# Patient Record
Sex: Male | Born: 1985 | Race: Asian | Hispanic: No | Marital: Married | Smoking: Never smoker
Health system: Southern US, Community
[De-identification: ages and names within clinical notes are randomized; demographics above are authoritative.]

## PROBLEM LIST (undated history)

## (undated) DIAGNOSIS — K219 Gastro-esophageal reflux disease without esophagitis: Secondary | ICD-10-CM

## (undated) DIAGNOSIS — I10 Essential (primary) hypertension: Secondary | ICD-10-CM

## (undated) HISTORY — DX: Essential (primary) hypertension: I10

## (undated) HISTORY — PX: OTHER SURGICAL HISTORY: SHX169

---

## 2008-03-06 ENCOUNTER — Emergency Department (HOSPITAL_COMMUNITY): Admission: EM | Admit: 2008-03-06 | Discharge: 2008-03-06 | Payer: Self-pay | Admitting: Emergency Medicine
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
## 2019-04-25 ENCOUNTER — Ambulatory Visit: Payer: Self-pay | Attending: Internal Medicine

## 2019-04-25 DIAGNOSIS — Z23 Encounter for immunization: Secondary | ICD-10-CM

## 2019-04-25 NOTE — Progress Notes (Signed)
   Covid-19 Vaccination Clinic  Name:  Bruce Sims    MRN: [id number] DOB: Jun 01, 1985  04/25/2019  [name] was observed post Covid-19 immunization for 15 minutes without incident. He was provided with Vaccine Information Sheet and instruction to access the V-Safe system.   [name] was instructed to call 911 with any severe reactions post vaccine: Marland Kitchen Difficulty breathing  . Swelling of face and throat  . A fast heartbeat  . A bad rash all over body  . Dizziness and weakness

## 2019-06-26 ENCOUNTER — Other Ambulatory Visit: Payer: Self-pay | Admitting: Internal Medicine

## 2019-06-26 DIAGNOSIS — Z1231 Encounter for screening mammogram for malignant neoplasm of breast: Secondary | ICD-10-CM

## 2019-07-03 ENCOUNTER — Other Ambulatory Visit: Payer: Self-pay

## 2019-07-03 ENCOUNTER — Ambulatory Visit
Admission: RE | Admit: 2019-07-03 | Discharge: 2019-07-03 | Disposition: A | Discharge status: Home / Self Care | Payer: Self-pay | Source: Ambulatory Visit | Attending: Internal Medicine | Admitting: Internal Medicine

## 2019-07-03 DIAGNOSIS — Z1231 Encounter for screening mammogram for malignant neoplasm of breast: Secondary | ICD-10-CM

## 2019-09-16 ENCOUNTER — Other Ambulatory Visit: Payer: Self-pay | Admitting: Internal Medicine

## 2019-09-16 DIAGNOSIS — R1084 Generalized abdominal pain: Secondary | ICD-10-CM

## 2019-11-27 ENCOUNTER — Other Ambulatory Visit: Payer: Self-pay

## 2019-11-27 ENCOUNTER — Ambulatory Visit (INDEPENDENT_AMBULATORY_CARE_PROVIDER_SITE_OTHER): Payer: 59 | Admitting: Internal Medicine

## 2019-11-27 VITALS — BP 170/100 | HR 87 | Temp 98.3°F | Ht 59.0 in | Wt 150.9 lb

## 2019-11-27 DIAGNOSIS — I10 Essential (primary) hypertension: Secondary | ICD-10-CM

## 2019-11-27 DIAGNOSIS — E669 Obesity, unspecified: Secondary | ICD-10-CM

## 2019-11-27 MED ORDER — HYDROCHLOROTHIAZIDE 25 MG PO TABS: 25.0000 mg | ORAL_TABLET | Freq: Every day | ORAL | 1 refills | Status: DC

## 2019-11-27 NOTE — Progress Notes (Signed)
New Patient Office Visit     This visit occurred during the SARS-CoV-2 public health emergency.  Safety protocols were in place, including screening questions prior to the visit, additional usage of staff PPE, and extensive cleaning of exam room while observing appropriate contact time as indicated for disinfecting solutions.    CC/Reason for Visit: Establish care, discuss headache Previous PCP: None Last Visit: Unknown  HPI: Bruce Sims is a 34 y.o. male who is coming in today for the above mentioned reasons.  She has no past medical history of significance but has not had routine medical care in years.  She is here today at the urging of her daughter.  She is here today with an official interpreter that is assisting with translation as she speaks very little [place].  She has been having headaches over the back of her head and neck.  No vision issues, no recent eye exam, she has had her flu vaccine but she is overdue for all other vaccinations, she had her mammogram earlier this year but has never had a colonoscopy.  She is found to have a very elevated blood pressure of 170/100 which is confirmed on repeat check.   Past Medical/Surgical History: History reviewed. No pertinent past medical history.  History reviewed. No pertinent surgical history.  Social History:  reports that she has never smoked. She has never used smokeless tobacco. She reports that she does not drink alcohol and does not use drugs.  Allergies: No Known Allergies  Family History:  History reviewed. No pertinent family history.   Current Outpatient Medications:  .  acetaminophen (TYLENOL) 325 MG tablet, Take 2 tablets (650 mg total) by mouth every 6 (six) hours as needed., Disp: 30 tablet, Rfl: 0 .  hydrochlorothiazide (HYDRODIURIL) 25 MG tablet, Take 1 tablet (25 mg total) by mouth daily., Disp: 90 tablet, Rfl: 1  Review of Systems:  Constitutional: Denies fever, chills, diaphoresis, appetite change and  fatigue.  HEENT: Denies photophobia, eye pain, redness, hearing loss, ear pain, congestion, sore throat, rhinorrhea, sneezing, mouth sores, trouble swallowing, neck pain, neck stiffness and tinnitus.   Respiratory: Denies SOB, DOE, cough, chest tightness,  and wheezing.   Cardiovascular: Denies chest pain, palpitations and leg swelling.  Gastrointestinal: Denies nausea, vomiting, abdominal pain, diarrhea, constipation, blood in stool and abdominal distention.  Genitourinary: Denies dysuria, urgency, frequency, hematuria, flank pain and difficulty urinating.  Endocrine: Denies: hot or cold intolerance, sweats, changes in hair or nails, polyuria, polydipsia. Musculoskeletal: Denies myalgias, back pain, joint swelling, arthralgias and gait problem.  Skin: Denies pallor, rash and wound.  Neurological: Denies dizziness, seizures, syncope, weakness, light-headedness, numbness. Hematological: Denies adenopathy. Easy bruising, personal or family bleeding history  Psychiatric/Behavioral: Denies suicidal ideation, mood changes, confusion, nervousness, sleep disturbance and agitation    Physical Exam: Vitals:   11/27/19 1516  BP: (!) 170/100  Pulse: 87  Temp: 98.3 F (36.8 C)  TempSrc: Oral  SpO2: 97%  Weight: 150 lb 14.4 oz (68.4 kg)  Height: 4\' 11"  (1.499 m)   Body mass index is 30.48 kg/m.  Constitutional: NAD, calm, comfortable Eyes: PERRL, lids and conjunctivae normal ENMT: Mucous membranes are moist.  Respiratory: clear to auscultation bilaterally, no wheezing, no crackles. Normal respiratory effort. No accessory muscle use.  Cardiovascular: Regular rate and rhythm, no murmurs / rubs / gallops. No extremity edema.   Neurologic: Grossly intact and nonfocal Psychiatric: Normal judgment and insight. Alert and oriented x 3. Normal mood.    Impression and Plan:  Obesity (BMI 30.0-34.9) -Discussed healthy lifestyle, including increased physical activity and better food choices to  promote weight loss.  Primary hypertension  -With very elevated blood pressures to 170/100 that is confirmed on 2 separate checks, I will go ahead and make this diagnosis. -Start hydrochlorothiazide 25 mg daily. -We have discussed low-sodium diet and she has been provided a handout. -She will return in 6 weeks for follow-up blood pressure, at that time we will also check basic metabolic profile to follow renal function and electrolytes.    Patient Instructions  -Nice seeing you today!!  -Start HCTZ 25 mg daily.  -Return in  6 weeks fasting for blood pressure check and labs.  -low sodium diet (see below).   Ch??ng trnh ?n u?ng DASH DASH Eating Plan DASH l vi?t t?t c?a "Dietary Approaches to Stop Hypertension", ngh?a l Ph??ng php ti?p c?n ch? ?? ?n u?ng ?? ng?n ch?n t?ng huy?t p. Ch??ng trnh ?n u?ng DASH l ch??ng trnh ?n u?ng lnh m?nh ? ???c ch?ng minh c tc d?ng lm gi?m huy?t p cao (t?ng huy?t p). N c?ng c th? lm gi?m nguy c? b? ti?u d??ng tup 2, b?nh tim v ??t qu?. Ch??ng trnh ?n u?ng DASH c?ng c th? gip gi?m cn. C nh?ng l?i khuyn no ?? lm theo ch??ng trnh ny?  H??ng d?n chung  Trnh ?n trn 2.300 mg (miligam) mu?i (natri) m?i ngy. N?u b? t?ng huy?t p, qu v? c th? c?n gi?m l??ng dng natri xu?ng ??n m?c 1.500 mg m?i ngy.  Gi?i h?n l??ng r??u qu v? u?ng khng qu 1 ly m?i ngy v?i ph? n? khng mang thai v 2 ly m?i ngy v?i nam gi?i. M?t ly t??ng ???ng v?i 12 ao-x? bia, 5 ao-x? r??u vang, ho?c 1 ao-x? r??u m?nh.  H?p tc v?i chuyn gia ch?m [place] s?c kh?e c?a qu v? ?? duy tr tr?ng l??ng c? th? c l?i cho s?c kh?e ho?c ?? gi?m cn. Hy h?i xem tr?ng l??ng no l l t??ng cho qu v?.  Dnh t nh?t 30 pht t?p th? d?c c th? khi?n tim qu v? ??p nhanh h?n (t?p th? d?c nh?p ?i?u) h?u h?t cc ngy trong tu?n. Cc ho?t ??ng c th? bao g?m ?i b?, b?i, ho?c ??p xe.  H?p tc v?i chuyn gia ch?m [place] s?c kh?e ho?c chuyn gia ch? ?? ?n v dinh d??ng (bc s?  chuyn khoa dinh d??ng) c?a qu v? ?? ?i?u ch?nh ch??ng trnh ?n u?ng ph h?p v?i nhu c?u calo c?a c nhn qu v?. ??c nhn th?c ph?m   Ki?m tra nhn th?c ph?m ?? xem hm l??ng natri cho m?i kh?u ph?n. Ch?n nh?ng th?c ph?m c d??i 5 ph?n tr?m L??ng natri hng ngy. Ni chung, nh?ng th?c ph?m c d??i 300 mg natri cho m?i kh?u ph?n ph h?p v?i ch??ng trnh ?n u?ng ny.  ?? tm cc lo?i ng? c?c nguyn h?t, hy ki?m t? "nguyn h?t", l t? ??u tin trong danh sch thnh ph?n. Mua s?m  Mua cc s?n ph?m dn nhn "natri th?p" ho?c "khng thm mu?i."  Mua th?c ph?m t??i. Trnh nh?ng th?c ph?m ?ng h?p v cc mn ?n ch? bi?n s?n ho?c ?ng l?nh. N?u n??ng  Trnh thm mu?i khi n?u ?n. S? d?ng gia v? khng co? mu?i ho?c th?o d??c thay v mu?i ?n ho?c mu?i bi?n. Ki?m tra v?i chuyn gia ch?m [place] s?c kh?e ho??c d???c sy? c?a quy? vi? tr??c khi s? d?ng cc s?n ph?m thay  th? mu?i.  Khng chin/rn ?? ?n. N?u ?? ?n b?ng nh?ng ph??ng php c l?i cho s?c kh?e nh? b? l, lu?c, n??ng v hun nng.  N?u ?n b?ng d?u t?t cho tim, ch?ng h?n nh? d?u  liu, c?i d?u, ??u nnh, ho?c h??ng d??ng. Ln k? ho?ch cho b?a ?n  ?n ch? ?? ?n lnh m?nh bao g?m: ? T? 5 kh?u ph?n tri cy v rau tr? ln m?i ngy. Vo m?i b?a ?n, c? g?ng dnh m?t n?a ??a cho tri cy v rau. ? T?i ?a 6-8 kh?u ph?n ng? c?c nguyn h?t m?i ngy. ? D??i 6 ao-x? th?t n?c, th?t gia c?m, ho?c c m?i ngy. M?t kh?u ph?n 3 ao-x? th?t t??ng ???ng kch th??c c?a m?t b? bi. M?t qu? tr?ng t??ng ???ng 1 ao-x?. ? 2 kh?u ph?n s?a t bo m?i ngy. ? M?t kh?u ph?n qu? h?ch, cc lo?i h?t, ho?c ??u 5 l?n m?i tu?n. ? Ch?t bo t?t cho tim. Nh?ng ch?t bo lnh m?nh c tn l axit bo Omega-3, ???c tm th?y trong cc th?c ph?m nh? h?t lanh v c n??c l?nh, nh? c mi, c h?i v c thu.  H?n ch? l??ng ?n nh?ng th?c ph?m sau ?y: ? Th?c ph?m ?ng h?p ho?c ?ng gi s?n. ? Th?c ph?m giu ch?t bo chuy?n ha , ch?ng h?n nh? th?c ph?m chin/rn. ? Th?c ph?m giu ch?t bo  bo ha, ch?ng h?n nh? th?t m?. ? ?? ng?t, cc mn trng mi?ng, ?? u?ng c ???ng v nh?ng th?c ph?m khc c b? sung ???ng. ? S?n ph?m t? s?a nguyn ch?t bo.  Khng thm mu?i vo th?c ph?m tr??c khi ?n.  C? g?ng ?n t?i thi?u 2 b?a chay m?i tu?n.  ?n nhi?u th?c ?n n?u t?i nh h?n v i?t th??c ?n ?? nh hng, th??c ?n t? ch?n v th?c ?n nhanh h?n.  Khi ?n ? nh hng, hy yu c?u th??c ?n c?a qu v? ???c n?u nha?t ho?c khng c mu?i, n?u c th?. Nh?ng lo?i th?c ?n no ???c khuy?n ngh?? Nh?ng m?c ???c li?t k c th? khng ph?i danh sch ??y ??. Hy trao ??i v?i bc s? chuyn khoa dinh d??ng v? cc l?a ch?n ch? ?? dinh d??ng no ph h?p nh?t v?i qu v?. Ng? c?c Bnh m nguyn h?t ho?c nguyn cm. M ?ng nguyn h?t ho?c nguyn cm. Ga?o l??t. B?t y?n m?ch. H?t dim m?ch (quinoa). T?m la m (bulgur). Ng? c?c nguyn h?t v ng? c?c t natri. Bnh m pita. Bnh quy gin t bo, t natri. Bnh ng nguyn cm. Derrek Gu c? Derrek Gu c? t??i ho?c ?ng l?nh (s?ng, h?p, bo? lo? ho?c n??ng). N???c p c chua v n??c rau p i?t natri ho??c gi?m natri. N??c x?t c chua va? h?n h?p c chua nho i?t natri ho??c gi?m natri. Rau ?o?ng h?p i?t natri ho??c gia?m natri. Tri cy T?t c? tri cy t??i, kh, ho?c ?ng l?nh. Tri cy ?ng h?p d??i d?ng n??c p t? nhin (khng c b? sung ???ng). Th?t v cc th?c ph?m c protein khc Ga? ho??c ga? ty bo? da. G ho?c g ty xay. Th?t heo l?c m?. C v h?i s?n. Lng tr?ng tr?ng. Cc lo?i ??u, ??u H Lan ho??c ??u l?ng kh. Qu? h?ch, b? h?t v cc lo?i h?t khng mu?i. ??u ?ng h?p khng ??p mu?i. Mi?ng th?t b n?c ? l?c m?. Th?t deli n?c, t natri. S?a S?a t bo (1%) ho?c khng bo (tch bo). Ph mt  khng bo, t bo, ho?c gi?m bo. Ph mt s?a g?n kem ho?c ph mt ricotta t natri, khng bo. S?a chua t bo ho?c khng bo. Ph mt t bo, t natri. M? v d?u B? th?c v?t m?m khng c ch?t bo chuy?n ha. D?u th?c v?t. N??c x?t mayonnaise v n??c tr?n sa lt t bo, gi?m bo,  ho?c lo?i nh? (gi?m natri). D?u h?t c?i d?u, d?u rum, d?u  liu, d?u ??u nnh v d?u h??ng d??ng. Qu? b?. Gia v? v cc th?c ph?m khc. Th?o d??c. Gia v?. H?n h?p gia v? khng c mu?i. B?ng ng va? ba?nh quy khng mu?i. ?? ng?t khng bo. Nh?ng lo?i th?c ?n no khng ???c khuy?n ngh?? Nh?ng m?c ???c li?t k c th? khng ph?i danh sch ??y ??. Hy trao ??i v?i bc s? chuyn khoa dinh d??ng v? cc l?a ch?n ch? ?? dinh d??ng no ph h?p nh?t v?i qu v?. Ng? c?c Cc lo?i bnh n??ng c ch?t bo, ch?ng h?n bnh s?ng b, bnh n??ng x?p, ho?c m?t s? lo?i bnh m. M ?ng kh ho?c cc ti b?t g?o. Derrek Gu c? Derrek Gu c? tr?n kem ho??c xa?o. Cc lo?i rau tr?n n??c x?t [place] mt. Rau ?o?ng h?p thng th??ng (khng ph?i lo?i t natri ho??c gia?m natri). N??c x?t c chua ?ng h?p va? h?n h?p c chua nho thng th??ng (khng ph?i lo?i i?t natri ho??c gi?m natri). N???c p rau v c chua thng th??ng (khng ph?i lo?i i?t natri ho??c gi?m natri). D?a chua.  liu. Tri cy Tri cy ?ng h?p ngm xi-r loa?ng ho??c ???c. Tri cy kh. Tri cy ngm trong kem ho?c n??c x?t b?. Th?t v cc th?c ph?m c protein khc Cc la?t thi?t m??. X??ng s??n. Th?t kh. Th?t l?n mu?i xng khi. Xc xch. Cc lo?i th?t hun khi v th?t h?p ch? bi?n s?n khc. Xalami. M? l?ng. Xc xch nng. Mn xc xch l?n ?? rn. Qu? h?ch v cc lo?i h?t ??p mu?i. ??u ?ng h?p c b? sung mu?i. C ?ng h?p ho?c hun khi. Nguyn qu? tr?ng ho?c lng ?? tr?ng. G ho?c g ty cn da. S?a S??a nguyn kem ho??c 2%, kem v n?a n? n?a kia. Ph mt nguyn kem ho?c ph mt kem nguyn ch?t bo. S??a chua nguyn ch?t bo ho?c s?a chua co? ????ng. [place] mt nguyn ch?t bo. B?t kem khng s?a. L??p phu? kem ?a? ?a?nh bng. [place] mt ch? bi?n s?n v [place] mt ph?t. M? v d?u B?. B? th?c v?t d?ng th?i. M? l?n. M? tr?u (shortening). B? s??a tru. M?? thi?t xng kho?i. D?u nhi?t ??i, ch?ng h?n nh? d?u d?a, d?u h?t c?, ho?c d?u c?. Gia v? v cc th?c ph?m khc. B?ng ng v bnh  quy m??n. Mu?i hnh, mu?i t?i, mu?i nm, mu?i ?n v mu?i bi?n. N???c x?t [place]. N???c x?t tartar. N??c x?t th?t quay. N???c x?t [place]. N??c t??ng, bao g?m loa?i gi?m natri. N??c x?t th?t n??ng. N???c thi?t ?ng h?p v ?ng gi. N??c m?m. D?u ho. N??c x?t cocktail. Cy c?i ng?a quy? vi? th?y trn k? ha?ng. N??c x?t c chua. M t?t. H??ng li?u th?t v ch?t la?m m?m thi?t. Tho?i b?t canh. N??t x?t nng v n??c x?t [place]. N??c x?t marinat ch? bi?n s?n ho?c ?ng gi. Gia v? taco ch? bi?n s?n ho?c ?ng gi. ?? gia v?. N???c tr?n sa la?t thng th???ng. N?i ?? tm thm thng tin:  Vi?n Tim, Ph?i v Mu Qu?c gia: [email or web address]  Hi?p H?i  Tim H[employer]. Heinz[employer]oa K?: [email or web address] Tm t?t  Ch??ng trnh ?n u?ng DASH l ch??ng trnh ?n u?ng lnh m?nh ? ???c ch?ng minh c tc d?ng lm gi?m huy?t p cao (t?ng huy?t p). N c?ng c th? lm gi?m nguy c? b? ti?u d??ng tup 2, b?nh tim v ??t qu?Marland Kitchen.  V?i ch??ng trnh ?n u?ng DASH, qu v? c?n ph?i gi?i h?n l??ng mu?i (natri) ? m?c 2.300 mg m?t ngy. N?u b? t?ng huy?t p, qu v? c th? c?n gi?m l??ng dng natri xu?ng ??n m?c 1.500 mg m?i ngy.  Khi ?ang th?c hi?n theo ch??ng trnh ?n u?ng DASH, m?c ?ch l ?n nhi?u tri cy v rau c? t??i, ng? c?c nguyn h?t, protein th?t n?c, s?a t bo v ch?t bo t?t cho tim h?n.  H?p tc v?i chuyn gia ch?m [place] s?c kh?e ho?c chuyn gia ch? ?? ?n v dinh d??ng (bc s? chuyn khoa dinh d??ng) c?a qu v? ?? ?i?u ch?nh ch??ng trnh ?n u?ng ph h?p v?i nhu c?u calo c?a c nhn qu v?. Thng tin ny khng nh?m m?c ?ch thay th? cho l?i khuyn m chuyn gia ch?m [place] s?c kh?e ni v?i qu v?. Hy b?o ??m qu v? ph?i th?o lu?n b?t k? v?n ?? g m qu v? c v?i chuyn gia ch?m [place] s?c kh?e c?a qu v?. Document Revised: 05/17/2016 Document Reviewed: 05/17/2016 Elsevier Patient Education  2020 Elsevier Inc.      Chaya JanEstela Hernandez Acosta, MD [facility]

## 2019-11-27 NOTE — Patient Instructions (Signed)
-Nice seeing you today!!  -Start HCTZ 25 mg daily.  -Return in  6 weeks fasting for blood pressure check and labs.  -low sodium diet (see below).   Ch??ng trnh ?n u?ng DASH DASH Eating Plan DASH l vi?t t?t c?a "Dietary Approaches to Stop Hypertension", ngh?a l Ph??ng php ti?p c?n ch? ?? ?n u?ng ?? ng?n ch?n t?ng huy?t p. Ch??ng trnh ?n u?ng DASH l ch??ng trnh ?n u?ng lnh m?nh ? ???c ch?ng minh c tc d?ng lm gi?m huy?t p cao (t?ng huy?t p). N c?ng c th? lm gi?m nguy c? b? ti?u d??ng tup 2, b?nh tim v ??t qu?. Ch??ng trnh ?n u?ng DASH c?ng c th? gip gi?m cn. C nh?ng l?i khuyn no ?? lm theo ch??ng trnh ny?  H??ng d?n chung  Trnh ?n trn 2.300 mg (miligam) mu?i (natri) m?i ngy. N?u b? t?ng huy?t p, qu v? c th? c?n gi?m l??ng dng natri xu?ng ??n m?c 1.500 mg m?i ngy.  Gi?i h?n l??ng r??u qu v? u?ng khng qu 1 ly m?i ngy v?i ph? n? khng mang thai v 2 ly m?i ngy v?i nam gi?i. M?t ly t??ng ???ng v?i 12 ao-x? bia, 5 ao-x? r??u vang, ho?c 1 ao-x? r??u m?nh.  H?p tc v?i chuyn gia ch?m [place] s?c kh?e c?a qu v? ?? duy tr tr?ng l??ng c? th? c l?i cho s?c kh?e ho?c ?? gi?m cn. Hy h?i xem tr?ng l??ng no l l t??ng cho qu v?.  Dnh t nh?t 30 pht t?p th? d?c c th? khi?n tim qu v? ??p nhanh h?n (t?p th? d?c nh?p ?i?u) h?u h?t cc ngy trong tu?n. Cc ho?t ??ng c th? bao g?m ?i b?, b?i, ho?c ??p xe.  H?p tc v?i chuyn gia ch?m [place] s?c kh?e ho?c chuyn gia ch? ?? ?n v dinh d??ng (bc s? chuyn khoa dinh d??ng) c?a qu v? ?? ?i?u ch?nh ch??ng trnh ?n u?ng ph h?p v?i nhu c?u calo c?a c nhn qu v?. ??c nhn th?c ph?m   Ki?m tra nhn th?c ph?m ?? xem hm l??ng natri cho m?i kh?u ph?n. Ch?n nh?ng th?c ph?m c d??i 5 ph?n tr?m L??ng natri hng ngy. Ni chung, nh?ng th?c ph?m c d??i 300 mg natri cho m?i kh?u ph?n ph h?p v?i ch??ng trnh ?n u?ng ny.  ?? tm cc lo?i ng? c?c nguyn h?t, hy ki?m t? "nguyn h?t", l t? ??u tin trong danh sch thnh  ph?n. Mua s?m  Mua cc s?n ph?m dn nhn "natri th?p" ho?c "khng thm mu?i."  Mua th?c ph?m t??i. Trnh nh?ng th?c ph?m ?ng h?p v cc mn ?n ch? bi?n s?n ho?c ?ng l?nh. N?u n??ng  Trnh thm mu?i khi n?u ?n. S? d?ng gia v? khng co? mu?i ho?c th?o d??c thay v mu?i ?n ho?c mu?i bi?n. Ki?m tra v?i chuyn gia ch?m [place] s?c kh?e ho??c d???c sy? c?a quy? vi? tr??c khi s? d?ng cc s?n ph?m thay th? mu?i.  Khng chin/rn ?? ?n. N?u ?? ?n b?ng nh?ng ph??ng php c l?i cho s?c kh?e nh? b? l, lu?c, n??ng v hun nng.  N?u ?n b?ng d?u t?t cho tim, ch?ng h?n nh? d?u  liu, c?i d?u, ??u nnh, ho?c h??ng d??ng. Ln k? ho?ch cho b?a ?n  ?n ch? ?? ?n lnh m?nh bao g?m: ? T? 5 kh?u ph?n tri cy v rau tr? ln m?i ngy. Vo m?i b?a ?n, c? g?ng dnh m?t n?a ??a cho tri cy v rau. ? T?i ?a  6-8 kh?u ph?n ng? c?c nguyn h?t m?i ngy. ? D??i 6 ao-x? th?t n?c, th?t gia c?m, ho?c c m?i ngy. M?t kh?u ph?n 3 ao-x? th?t t??ng ???ng kch th??c c?a m?t b? bi. M?t qu? tr?ng t??ng ???ng 1 ao-x?. ? 2 kh?u ph?n s?a t bo m?i ngy. ? M?t kh?u ph?n qu? h?ch, cc lo?i h?t, ho?c ??u 5 l?n m?i tu?n. ? Ch?t bo t?t cho tim. Nh?ng ch?t bo lnh m?nh c tn l axit bo Omega-3, ???c tm th?y trong cc th?c ph?m nh? h?t lanh v c n??c l?nh, nh? c mi, c h?i v c thu.  H?n ch? l??ng ?n nh?ng th?c ph?m sau ?y: ? Th?c ph?m ?ng h?p ho?c ?ng gi s?n. ? Th?c ph?m giu ch?t bo chuy?n ha , ch?ng h?n nh? th?c ph?m chin/rn. ? Th?c ph?m giu ch?t bo bo ha, ch?ng h?n nh? th?t m?. ? ?? ng?t, cc mn trng mi?ng, ?? u?ng c ???ng v nh?ng th?c ph?m khc c b? sung ???ng. ? S?n ph?m t? s?a nguyn ch?t bo.  Khng thm mu?i vo th?c ph?m tr??c khi ?n.  C? g?ng ?n t?i thi?u 2 b?a chay m?i tu?n.  ?n nhi?u th?c ?n n?u t?i nh h?n v i?t th??c ?n ?? nh hng, th??c ?n t? ch?n v th?c ?n nhanh h?n.  Khi ?n ? nh hng, hy yu c?u th??c ?n c?a qu v? ???c n?u nha?t ho?c khng c mu?i, n?u c th?. Nh?ng lo?i th?c ?n  no ???c khuy?n ngh?? Nh?ng m?c ???c li?t k c th? khng ph?i danh sch ??y ??. Hy trao ??i v?i bc s? chuyn khoa dinh d??ng v? cc l?a ch?n ch? ?? dinh d??ng no ph h?p nh?t v?i qu v?. Ng? c?c Bnh m nguyn h?t ho?c nguyn cm. M ?ng nguyn h?t ho?c nguyn cm. Ga?o l??t. B?t y?n m?ch. H?t dim m?ch (quinoa). T?m la m (bulgur). Ng? c?c nguyn h?t v ng? c?c t natri. Bnh m pita. Bnh quy gin t bo, t natri. Bnh ng nguyn cm. Derrek Gu c? Derrek Gu c? t??i ho?c ?ng l?nh (s?ng, h?p, bo? lo? ho?c n??ng). N???c p c chua v n??c rau p i?t natri ho??c gi?m natri. N??c x?t c chua va? h?n h?p c chua nho i?t natri ho??c gi?m natri. Rau ?o?ng h?p i?t natri ho??c gia?m natri. Tri cy T?t c? tri cy t??i, kh, ho?c ?ng l?nh. Tri cy ?ng h?p d??i d?ng n??c p t? nhin (khng c b? sung ???ng). Th?t v cc th?c ph?m c protein khc Ga? ho??c ga? ty bo? da. G ho?c g ty xay. Th?t heo l?c m?. C v h?i s?n. Lng tr?ng tr?ng. Cc lo?i ??u, ??u H Lan ho??c ??u l?ng kh. Qu? h?ch, b? h?t v cc lo?i h?t khng mu?i. ??u ?ng h?p khng ??p mu?i. Mi?ng th?t b n?c ? l?c m?. Th?t deli n?c, t natri. S?a S?a t bo (1%) ho?c khng bo (tch bo). Ph mt khng bo, t bo, ho?c gi?m bo. Ph mt s?a g?n kem ho?c ph mt ricotta t natri, khng bo. S?a chua t bo ho?c khng bo. Ph mt t bo, t natri. M? v d?u B? th?c v?t m?m khng c ch?t bo chuy?n ha. D?u th?c v?t. N??c x?t mayonnaise v n??c tr?n sa lt t bo, gi?m bo, ho?c lo?i nh? (gi?m natri). D?u h?t c?i d?u, d?u rum, d?u  liu, d?u ??u nnh v d?u h??ng d??ng. Qu? b?. Gia v? v cc th?c ph?m khc. Th?o d??c. Milagros Loll  v?. H?n h?p gia v? khng c mu?i. B?ng ng va? ba?nh quy khng mu?i. ?? ng?t khng bo. Nh?ng lo?i th?c ?n no khng ???c khuy?n ngh?? Nh?ng m?c ???c li?t k c th? khng ph?i danh sch ??y ??. Hy trao ??i v?i bc s? chuyn khoa dinh d??ng v? cc l?a ch?n ch? ?? dinh d??ng no ph h?p nh?t v?i qu v?. Ng? c?c Cc lo?i  bnh n??ng c ch?t bo, ch?ng h?n bnh s?ng b, bnh n??ng x?p, ho?c m?t s? lo?i bnh m. M ?ng kh ho?c cc ti b?t g?o. Derrek Gu c? Derrek Gu c? tr?n kem ho??c xa?o. Cc lo?i rau tr?n n??c x?t Matthew mt. Rau ?o?ng h?p thng th??ng (khng ph?i lo?i t natri ho??c gia?m natri). N??c x?t c chua ?ng h?p va? h?n h?p c chua nho thng th??ng (khng ph?i lo?i i?t natri ho??c gi?m natri). N???c p rau v c chua thng th??ng (khng ph?i lo?i i?t natri ho??c gi?m natri). D?a chua.  liu. Tri cy Tri cy ?ng h?p ngm xi-r loa?ng ho??c ???c. Tri cy kh. Tri cy ngm trong kem ho?c n??c x?t b?. Th?t v cc th?c ph?m c protein khc Cc la?t thi?t m??. X??ng s??n. Th?t kh. Th?t l?n mu?i xng khi. Xc xch. Cc lo?i th?t hun khi v th?t h?p ch? bi?n s?n khc. Xalami. M? l?ng. Xc xch nng. Mn xc xch l?n ?? rn. Qu? h?ch v cc lo?i h?t ??p mu?i. ??u ?ng h?p c b? sung mu?i. C ?ng h?p ho?c hun khi. Nguyn qu? tr?ng ho?c lng ?? tr?ng. G ho?c g ty cn da. S?a S??a nguyn kem ho??c 2%, kem v n?a n? n?a kia. Ph mt nguyn kem ho?c ph mt kem nguyn ch?t bo. S??a chua nguyn ch?t bo ho?c s?a chua co? ????ng. Nathaneil mt nguyn ch?t bo. B?t kem khng s?a. L??p phu? kem ?a? ?a?nh bng. Tyliek mt ch? bi?n s?n v Jahlen mt ph?t. M? v d?u B?. B? th?c v?t d?ng th?i. M? l?n. M? tr?u (shortening). B? s??a tru. M?? thi?t xng kho?i. D?u nhi?t ??i, ch?ng h?n nh? d?u d?a, d?u h?t c?, ho?c d?u c?. Gia v? v cc th?c ph?m khc. B?ng ng v bnh quy m??n. Mu?i hnh, mu?i t?i, mu?i nm, mu?i ?n v mu?i bi?n. N???c x?t [place]. N???c x?t tartar. N??c x?t th?t quay. N???c x?t [place]. N??c t??ng, bao g?m loa?i gi?m natri. N??c x?t th?t n??ng. N???c thi?t ?ng h?p v ?ng gi. N??c m?m. D?u ho. N??c x?t cocktail. Cy c?i ng?a quy? vi? th?y trn k? ha?ng. N??c x?t c chua. M t?t. H??ng li?u th?t v ch?t la?m m?m thi?t. Tho?i b?t canh. N??t x?t nng v n??c x?t [place]. N??c x?t marinat ch? bi?n s?n ho?c  ?ng gi. Gia v? taco ch? bi?n s?n ho?c ?ng gi. ?? gia v?. N???c tr?n sa la?t thng th???ng. N?i ?? tm thm thng tin:  Vi?n Tim, Ph?i v Mu Qu?c gia: [email or web address].is  Hi?p H?i [employer] K?: [email or web address] Tm t?t  Ch??ng trnh ?n u?ng DASH l ch??ng trnh ?n u?ng lnh m?nh ? ???c ch?ng minh c tc d?ng lm gi?m huy?t p cao (t?ng huy?t p). N c?ng c th? lm gi?m nguy c? b? ti?u d??ng tup 2, b?nh tim v ??t qu?Marland Kitchen  V?i ch??ng trnh ?n u?ng DASH, qu v? c?n ph?i gi?i h?n l??ng mu?i (natri) ? m?c 2.300 mg m?t ngy. N?u b? t?ng huy?t p, qu v? c th? c?n gi?m l??ng dng natri xu?ng ??n m?c 1.500 mg m?i  ngy.  Khi ?ang th?c hi?n theo ch??ng trnh ?n u?ng DASH, m?c ?ch l ?n nhi?u tri cy v rau c? t??i, ng? c?c nguyn h?t, protein th?t n?c, s?a t bo v ch?t bo t?t cho tim h?n.  H?p tc v?i chuyn gia ch?m [place] s?c kh?e ho?c chuyn gia ch? ?? ?n v dinh d??ng (bc s? chuyn khoa dinh d??ng) c?a qu v? ?? ?i?u ch?nh ch??ng trnh ?n u?ng ph h?p v?i nhu c?u calo c?a c nhn qu v?. Thng tin ny khng nh?m m?c ?ch thay th? cho l?i khuyn m chuyn gia ch?m [place] s?c kh?e ni v?i qu v?. Hy b?o ??m qu v? ph?i th?o lu?n b?t k? v?n ?? g m qu v? c v?i chuyn gia ch?m [place] s?c kh?e c?a qu v?. Document Revised: 05/17/2016 Document Reviewed: 05/17/2016 Elsevier Patient Education  2020 [employer]

## 2020-01-08 ENCOUNTER — Ambulatory Visit: Payer: Self-pay | Admitting: Internal Medicine

## 2020-03-23 ENCOUNTER — Ambulatory Visit (INDEPENDENT_AMBULATORY_CARE_PROVIDER_SITE_OTHER): Payer: 59 | Admitting: Family Medicine

## 2020-03-23 DIAGNOSIS — Z5329 Procedure and treatment not carried out because of patient's decision for other reasons: Secondary | ICD-10-CM

## 2020-03-23 NOTE — Progress Notes (Signed)
Patient was a no-show to their first patient encounter to establish care. At the [facility].   Peggyann Shoals, DO [facility], PGY-3 03/24/2020 1:30 PM

## 2021-06-05 ENCOUNTER — Ambulatory Visit (HOSPITAL_COMMUNITY)
Admission: RE | Admit: 2021-06-05 | Discharge: 2021-06-05 | Discharge status: Absent without leave | Payer: Self-pay | Source: Ambulatory Visit

## 2021-09-29 ENCOUNTER — Encounter (HOSPITAL_COMMUNITY): Payer: Self-pay | Admitting: *Deleted

## 2021-09-29 ENCOUNTER — Ambulatory Visit (HOSPITAL_COMMUNITY)
Admission: EM | Admit: 2021-09-29 | Discharge: 2021-09-29 | Disposition: A | Discharge status: Home / Self Care | Payer: No Typology Code available for payment source | Attending: Emergency Medicine | Admitting: Emergency Medicine

## 2021-09-29 DIAGNOSIS — R1032 Left lower quadrant pain: Secondary | ICD-10-CM

## 2021-09-29 HISTORY — DX: Essential (primary) hypertension: I10

## 2021-09-29 MED ORDER — ALUM & MAG HYDROXIDE-SIMETH 200-200-20 MG/5ML PO SUSP
30.0000 mL | Freq: Once | ORAL | Status: AC
  Administered 2021-09-29: 30 mL via ORAL

## 2021-09-29 MED ORDER — ALUM & MAG HYDROXIDE-SIMETH 200-200-20 MG/5ML PO SUSP
ORAL | Status: AC
  Filled 2021-09-29: qty 30

## 2021-09-29 NOTE — ED Provider Notes (Signed)
[facility]    CSN: [id number] Arrival date & time: 09/29/21  0805      History   Chief Complaint Chief Complaint  Patient presents with   Abdominal Pain    HPI Yoshio Ruby is a 36 y.o. male.   Patient presents with intermittent left lower quadrant abdominal pain for 2 days.  Symptoms are worse in the mornings, can hear a rumbling sound and pain is described as sharp and nonradiating.  Has attempted use of Tylenol which has been minimally effective.  Last bowel movement this morning, described as normal, firm and brown.  Denies nausea, vomiting, diarrhea, fever, chills, abdominal bloating, dietary changes or recent travels.   Past Medical History:  Diagnosis Date   Hypertension     There are no problems to display for this patient.   Past Surgical History:  Procedure Laterality Date   arm surgery Right        Home Medications    Prior to Admission medications   Not on File    Family History Family History  Problem Relation Age of Onset   Healthy Mother    Hypertension Father     Social History Social History   Tobacco Use   Smoking status: Never   Smokeless tobacco: Never  Vaping Use   Vaping Use: Never used  Substance Use Topics   Alcohol use: Never   Drug use: Never     Allergies   Patient has no known allergies.   Review of Systems Review of Systems  Constitutional: Negative.   Respiratory: Negative.    Cardiovascular: Negative.   Gastrointestinal:  Positive for abdominal pain. Negative for abdominal distention, anal bleeding, blood in stool, constipation, diarrhea, nausea, rectal pain and vomiting.  Skin: Negative.   Neurological: Negative.      Physical Exam Triage Vital Signs ED Triage Vitals  Enc Vitals Group     BP 09/29/21 0826 (!) 151/92     Pulse Rate 09/29/21 0826 73     Resp 09/29/21 0826 18     Temp 09/29/21 0826 98.5 F (36.9 C)     Temp Source 09/29/21 0826 Oral     SpO2 09/29/21 0826 97 %     Weight --       Height --      Head Circumference --      Peak Flow --      Pain Score 09/29/21 0822 2     Pain Loc --      Pain Edu? --      Excl. in GC? --    No data found.  Updated Vital Signs BP (!) 151/92 (BP Location: Right Arm)   Pulse 73   Temp 98.5 F (36.9 C) (Oral)   Resp 18   SpO2 97%   Visual Acuity Right Eye Distance:   Left Eye Distance:   Bilateral Distance:    Right Eye Near:   Left Eye Near:    Bilateral Near:     Physical Exam Constitutional:      Appearance: He is well-developed.  HENT:     Head: Normocephalic.  Pulmonary:     Effort: Pulmonary effort is normal.  Abdominal:     General: Abdomen is flat. Bowel sounds are increased.     Palpations: Abdomen is soft.     Tenderness: There is no abdominal tenderness.  Skin:    General: Skin is warm and dry.  Neurological:     General: No focal deficit present.  Mental Status: He is alert and oriented to person, place, and time.  Psychiatric:        Mood and Affect: Mood normal.        Behavior: Behavior normal.      UC Treatments / Results  Labs (all labs ordered are listed, but only abnormal results are displayed) Labs Reviewed - No data to display  EKG   Radiology No results found.  Procedures Procedures (including critical care time)  Medications Ordered in UC Medications - No data to display  Initial Impression / Assessment and Plan / UC Course  I have reviewed the triage vital signs and the nursing notes.  Pertinent labs & imaging results that were available during my care of the patient were reviewed by me and considered in my medical decision making (see chart for details).  Left lower quadrant abdominal pain  On exam there is minimal tenderness to the abdomen but there increased bowel sounds throughout, discussed with patient, low suspicion for an acute abdomen at this time, Maalox given in office and minimal improvement seen after evaluation on 10 to 15 minutes medicine does  not peak, discussed this with patient, if medicine deemed helpful he may purchase over-the-counter for home use, recommended continue over-the-counter analgesics and a bland diet for additional supportive care, given strict precautions that if abdominal pain worsens in severity or frequency that he is to go to the nearest emergency department for further management, verbalized understanding Final Clinical Impressions(s) / UC Diagnoses   Final diagnoses:  None   Discharge Instructions   None    ED Prescriptions   None    PDMP not reviewed this encounter.   Valinda Hoar, [place] 09/29/21 [phone]

## 2021-09-29 NOTE — Discharge Instructions (Signed)
On exam today there is no tenderness to your abdomen but I am able to hear your stomach rumbling, have a low suspicion that the cause of your symptoms is something more serious with your organs  You were given a dose of Maalox here today in the office, it will reach its maximum effect in 1 hour, if you find this medicine helpful you may purchase this medicine over-the-counter  You may continue use of Tylenol for management of your pain  While symptoms are present try to eat a blander diet while avoiding greasy and spicy foods to prevent further stomach irritation  At any point if your abdominal pain becomes severe or constant please go to the nearest emergency department for further evaluation as you may need imaging

## 2021-09-29 NOTE — ED Triage Notes (Signed)
Pt states that he has had LLQ pain x 2-3 days. No diarrhea or vomiting. He took tylenol last night.  He also would like his blood pressure and blood sugar checked just to make sure he doesn't have anything.

## 2021-11-17 ENCOUNTER — Ambulatory Visit (INDEPENDENT_AMBULATORY_CARE_PROVIDER_SITE_OTHER): Payer: No Typology Code available for payment source | Admitting: Family Medicine

## 2021-11-17 ENCOUNTER — Encounter: Payer: Self-pay | Admitting: Family Medicine

## 2021-11-17 VITALS — BP 126/64 | HR 64 | Temp 98.6°F | Ht 63.0 in | Wt 152.0 lb

## 2021-11-17 DIAGNOSIS — Z0001 Encounter for general adult medical examination with abnormal findings: Secondary | ICD-10-CM | POA: Diagnosis not present

## 2021-11-17 DIAGNOSIS — R197 Diarrhea, unspecified: Secondary | ICD-10-CM | POA: Diagnosis not present

## 2021-11-17 DIAGNOSIS — Z114 Encounter for screening for human immunodeficiency virus [HIV]: Secondary | ICD-10-CM

## 2021-11-17 DIAGNOSIS — Z136 Encounter for screening for cardiovascular disorders: Secondary | ICD-10-CM | POA: Diagnosis not present

## 2021-11-17 DIAGNOSIS — Z1159 Encounter for screening for other viral diseases: Secondary | ICD-10-CM

## 2021-11-17 DIAGNOSIS — Z23 Encounter for immunization: Secondary | ICD-10-CM | POA: Diagnosis not present

## 2021-11-17 LAB — URINALYSIS, ROUTINE W REFLEX MICROSCOPIC
Bilirubin Urine: NEGATIVE
Hgb urine dipstick: NEGATIVE
Ketones, ur: NEGATIVE
Leukocytes,Ua: NEGATIVE
Nitrite: NEGATIVE
RBC / HPF: NONE SEEN (ref 0–?)
Specific Gravity, Urine: 1.015 (ref 1.000–1.030)
Total Protein, Urine: NEGATIVE
Urine Glucose: NEGATIVE
Urobilinogen, UA: 0.2 (ref 0.0–1.0)
pH: 8.5 — AB (ref 5.0–8.0)

## 2021-11-17 LAB — CBC WITH DIFFERENTIAL/PLATELET
Basophils Absolute: 0 10*3/uL (ref 0.0–0.1)
Basophils Relative: 0.4 % (ref 0.0–3.0)
Eosinophils Absolute: 0.1 10*3/uL (ref 0.0–0.7)
Eosinophils Relative: 1.3 % (ref 0.0–5.0)
HCT: 48.5 % (ref 39.0–52.0)
Hemoglobin: 15.8 g/dL (ref 13.0–17.0)
Lymphocytes Relative: 17.1 % (ref 12.0–46.0)
Lymphs Abs: 1.7 10*3/uL (ref 0.7–4.0)
MCHC: 32.5 g/dL (ref 30.0–36.0)
MCV: 79.2 fl (ref 78.0–100.0)
Monocytes Absolute: 0.9 10*3/uL (ref 0.1–1.0)
Monocytes Relative: 9.1 % (ref 3.0–12.0)
Neutro Abs: 7.3 10*3/uL (ref 1.4–7.7)
Neutrophils Relative %: 72.1 % (ref 43.0–77.0)
Platelets: 157 10*3/uL (ref 150.0–400.0)
RBC: 6.13 Mil/uL — ABNORMAL HIGH (ref 4.22–5.81)
RDW: 14 % (ref 11.5–15.5)
WBC: 10.1 10*3/uL (ref 4.0–10.5)

## 2021-11-17 LAB — LIPID PANEL
Cholesterol: 249 mg/dL — ABNORMAL HIGH (ref 0–200)
HDL: 84.3 mg/dL (ref >39.00)
LDL Cholesterol: 128 mg/dL — ABNORMAL HIGH (ref 0–99)
NonHDL: 165.1
Total CHOL/HDL Ratio: 3
Triglycerides: 188 mg/dL — ABNORMAL HIGH (ref 0.0–149.0)
VLDL: 37.6 mg/dL (ref 0.0–40.0)

## 2021-11-17 LAB — TSH: TSH: 2.22 u[IU]/mL (ref 0.35–5.50)

## 2021-11-17 LAB — COMPREHENSIVE METABOLIC PANEL
ALT: 34 U/L (ref 0–53)
AST: 23 U/L (ref 0–37)
Albumin: 4.9 g/dL (ref 3.5–5.2)
Alkaline Phosphatase: 39 U/L (ref 39–117)
BUN: 12 mg/dL (ref 6–23)
CO2: 26 mEq/L (ref 19–32)
Calcium: 10.1 mg/dL (ref 8.4–10.5)
Chloride: 98 mEq/L (ref 96–112)
Creatinine, Ser: 0.9 mg/dL (ref 0.40–1.50)
GFR: 109.43 mL/min (ref >60.00)
Glucose, Bld: 98 mg/dL (ref 70–99)
Potassium: 4.4 mEq/L (ref 3.5–5.1)
Sodium: 138 mEq/L (ref 135–145)
Total Bilirubin: 0.8 mg/dL (ref 0.2–1.2)
Total Protein: 8.4 g/dL — ABNORMAL HIGH (ref 6.0–8.3)

## 2021-11-17 NOTE — Progress Notes (Unsigned)
Subjective:    Patient ID: Bruce Sims, male    DOB: 22-Jul-1985, 36 y.o.   MRN: [id number]  HPI Chief Complaint  Patient presents with   Annual Exam   He is new to the practice and here for a complete physical exam.  Reviewed allergies, medications, past medical, surgical, family, and social history.  Reports diarrhea without fever, chills, weight loss, abdominal pain, N/V or blood in stool.   Social history: Married. 2 children.  Denies smoking, drinking alcohol, drug use Diet: healthy  Exercise: none   Health maintenance:   Last Dental Exam: overdue  Last Eye Exam: overdue    Reviewed allergies, medications, past medical, surgical, family, and social history.     Review of Systems Review of Systems Constitutional: -fever, -chills, -sweats, -unexpected weight change,-fatigue ENT: -runny nose, -ear pain, -sore throat Cardiology:  -chest pain, -palpitations, -edema Respiratory: -cough, -shortness of breath, -wheezing Gastroenterology: -abdominal pain, -nausea, -vomiting, +diarrhea, -constipation Hematology: -bleeding or bruising problems Musculoskeletal: -arthralgias, -myalgias, -joint swelling, -back pain Ophthalmology: -vision changes Urology: -dysuria, -difficulty urinating, -hematuria, -urinary frequency, -urgency Neurology: -headache, -weakness, -tingling, -numbness       Objective:   Physical Exam BP 126/64   Pulse 64   Temp 98.6 F (37 C)   Ht 5\' 3"  (1.6 m)   Wt 152 lb (68.9 kg)   SpO2 98%   BMI 26.93 kg/m   General Appearance:    Alert, cooperative, no distress, appears stated age  Head:    Normocephalic, without obvious abnormality, atraumatic  Eyes:    PERRL, conjunctiva/corneas clear, EOM's intact  Ears:    Normal TM's and external ear canals  Nose:   Nares normal, mucosa normal, no drainage  Throat:   Lips, mucosa, and tongue normal  Neck:   Supple, no lymphadenopathy;  thyroid:  no   enlargement/tenderness/nodules  Back:    Spine nontender, no  curvature, ROM normal, no CVA     tenderness  Lungs:     Clear to auscultation bilaterally without wheezes, rales or     ronchi; respirations unlabored  Chest Wall:    No tenderness or deformity   Heart:    Regular rate and rhythm  Breast Exam:    No chest wall tenderness, masses or gynecomastia  Abdomen:     Soft, non-tender, nondistended, normoactive bowel sounds,    no masses, no hepatosplenomegaly  Genitalia:    Declines   Rectal:   Deferred due to age <40 and lack of symptoms  Extremities:   No clubbing, cyanosis or edema  Pulses:   2+ and symmetric all extremities  Skin:   Skin color, texture, turgor normal, no rashes or lesions  Lymph nodes:   Cervical, supraclavicular, and axillary nodes normal  Neurologic:   CNII-XII intact, normal strength, sensation and gait          Psych:   Normal mood, affect, hygiene and grooming.        Assessment & Plan:  Encounter for general adult medical examination with abnormal findings - Plan: CBC with Differential/Platelet, Comprehensive metabolic panel, Lipid panel, Urinalysis, Routine w reflex microscopic, Urinalysis, Routine w reflex microscopic, Lipid panel, Comprehensive metabolic panel, CBC with Differential/Platelet He is a pleasant 36 year old male who is new to the practice and here for a CPE.  Preventive health care reviewed.  Recommend scheduling dental and eye exam since he is overdue.  Counseling on healthy lifestyle including diet and exercise.  Recommend monthly self testicular exams.  Immunizations reviewed. Check  labs  Diarrhea, unspecified type - Plan: TSH, TSH -No red flag symptoms.  Check labs and follow-up if not improving.  Screening for HIV without presence of risk factors - Plan: HIV Antibody (routine testing w rflx), HIV Antibody (routine testing w rflx) -Done per screening guidelines  Need for hepatitis C screening test - Plan: Hepatitis C antibody, Hepatitis C antibody -Done per screening guidelines  Need for  immunization against influenza - Plan: Flu Vaccine QUAD 71mo+IM (Fluarix, Fluzone & Alfiuria Quad PF)

## 2021-11-17 NOTE — Patient Instructions (Signed)
For your diarrhea: stay well hydrated.  You can take over the counter Imodium for diarrhea. If it does not stop in the next few days, come back to see [place].    Preventive Care 9-37 Years Old, Male Preventive care refers to lifestyle choices and visits with your health care provider that can promote health and wellness. Preventive care visits are also called wellness exams. What can I expect for my preventive care visit? Counseling During your preventive care visit, your health care provider may ask about your: Medical history, including: Past medical problems. Family medical history. Current health, including: Emotional well-being. Home life and relationship well-being. Sexual activity. Lifestyle, including: Alcohol, nicotine or tobacco, and drug use. Access to firearms. Diet, exercise, and sleep habits. Safety issues such as seatbelt and bike helmet use. Sunscreen use. Work and work Astronomer. Physical exam Your health care provider may check your: Height and weight. These may be used to calculate your BMI (body mass index). BMI is a measurement that tells if you are at a healthy weight. Waist circumference. This measures the distance around your waistline. This measurement also tells if you are at a healthy weight and may help predict your risk of certain diseases, such as type 2 diabetes and high blood pressure. Heart rate and blood pressure. Body temperature. Skin for abnormal spots. What immunizations do I need?  Vaccines are usually given at various ages, according to a schedule. Your health care provider will recommend vaccines for you based on your age, medical history, and lifestyle or other factors, such as travel or where you work. What tests do I need? Screening Your health care provider may recommend screening tests for certain conditions. This may include: Lipid and cholesterol levels. Diabetes screening. This is done by checking your blood sugar (glucose) after  you have not eaten for a while (fasting). Hepatitis B test. Hepatitis C test. HIV (human immunodeficiency virus) test. STI (sexually transmitted infection) testing, if you are at risk. Talk with your health care provider about your test results, treatment options, and if necessary, the need for more tests. Follow these instructions at home: Eating and drinking  Eat a healthy diet that includes fresh fruits and vegetables, whole grains, lean protein, and low-fat dairy products. Drink enough fluid to keep your urine pale yellow. Take vitamin and mineral supplements as recommended by your health care provider. Do not drink alcohol if your health care provider tells you not to drink. If you drink alcohol: Limit how much you have to 0-2 drinks a day. Know how much alcohol is in your drink. In the [place] one drink equals one 12 oz bottle of beer (355 mL), one 5 oz glass of wine (148 mL), or one 1 oz glass of hard liquor (44 mL). Lifestyle Brush your teeth every morning and night with fluoride toothpaste. Floss one time each day. Exercise for at least 30 minutes 5 or more days each week. Do not use any products that contain nicotine or tobacco. These products include cigarettes, chewing tobacco, and vaping devices, such as e-cigarettes. If you need help quitting, ask your health care provider. Do not use drugs. If you are sexually active, practice safe sex. Use a condom or other form of protection to prevent STIs. Find healthy ways to manage stress, such as: Meditation, yoga, or listening to music. Journaling. Talking to a trusted person. Spending time with friends and family. Minimize exposure to UV radiation to reduce your risk of skin cancer. Safety Always wear your seat  belt while driving or riding in a vehicle. Do not drive: If you have been drinking alcohol. Do not ride with someone who has been drinking. If you have been using any mind-altering substances or drugs. While  texting. When you are tired or distracted. Wear a helmet and other protective equipment during sports activities. If you have firearms in your house, make sure you follow all gun safety procedures. Seek help if you have been physically or sexually abused. What's next? Go to your health care provider once a year for an annual wellness visit. Ask your health care provider how often you should have your eyes and teeth checked. Stay up to date on all vaccines. This information is not intended to replace advice given to you by your health care provider. Make sure you discuss any questions you have with your health care provider. Document Revised: 07/13/2020 Document Reviewed: 07/13/2020 Elsevier Patient Education  [place].

## 2021-11-20 LAB — HEPATITIS C ANTIBODY: Hepatitis C Ab: NONREACTIVE

## 2021-11-20 LAB — HIV ANTIBODY (ROUTINE TESTING W REFLEX): HIV 1&2 Ab, 4th Generation: NONREACTIVE

## 2021-11-20 NOTE — Progress Notes (Signed)
His labs are fine except his bad cholesterol (the LDL) is high at 128 and the goal is less than 100. His triglycerides are also high. I recommend cutting back on fried foods, foods high in fat and cholesterol and getting at least 150 minutes of physical activity each week.

## 2021-12-15 ENCOUNTER — Other Ambulatory Visit: Payer: Self-pay

## 2021-12-15 ENCOUNTER — Encounter (HOSPITAL_COMMUNITY): Payer: Self-pay

## 2021-12-15 ENCOUNTER — Emergency Department (HOSPITAL_COMMUNITY)
Admission: EM | Admit: 2021-12-15 | Discharge: 2021-12-15 | Disposition: A | Discharge status: Home / Self Care | Payer: No Typology Code available for payment source | Attending: Emergency Medicine | Admitting: Emergency Medicine

## 2021-12-15 ENCOUNTER — Emergency Department (HOSPITAL_COMMUNITY): Payer: No Typology Code available for payment source

## 2021-12-15 DIAGNOSIS — R1013 Epigastric pain: Secondary | ICD-10-CM | POA: Insufficient documentation

## 2021-12-15 DIAGNOSIS — R1010 Upper abdominal pain, unspecified: Secondary | ICD-10-CM

## 2021-12-15 LAB — COMPREHENSIVE METABOLIC PANEL
ALT: 45 U/L — ABNORMAL HIGH (ref 0–44)
AST: 29 U/L (ref 15–41)
Albumin: 4.6 g/dL (ref 3.5–5.0)
Alkaline Phosphatase: 42 U/L (ref 38–126)
Anion gap: 8 (ref 5–15)
BUN: 14 mg/dL (ref 6–20)
CO2: 27 mmol/L (ref 22–32)
Calcium: 9.1 mg/dL (ref 8.9–10.3)
Chloride: 102 mmol/L (ref 98–111)
Creatinine, Ser: 0.73 mg/dL (ref 0.61–1.24)
GFR, Estimated: >60 mL/min (ref >60)
Glucose, Bld: 94 mg/dL (ref 70–99)
Potassium: 3.5 mmol/L (ref 3.5–5.1)
Sodium: 137 mmol/L (ref 135–145)
Total Bilirubin: 1 mg/dL (ref 0.3–1.2)
Total Protein: 8.3 g/dL — ABNORMAL HIGH (ref 6.5–8.1)

## 2021-12-15 LAB — CBC WITH DIFFERENTIAL/PLATELET
Abs Immature Granulocytes: 0.01 K/uL (ref 0.00–0.07)
Basophils Absolute: 0.1 K/uL (ref 0.0–0.1)
Basophils Relative: 1 %
Eosinophils Absolute: 0.2 K/uL (ref 0.0–0.5)
Eosinophils Relative: 3 %
HCT: 51.1 % (ref 39.0–52.0)
Hemoglobin: 16.3 g/dL (ref 13.0–17.0)
Immature Granulocytes: 0 %
Lymphocytes Relative: 36 %
Lymphs Abs: 2.3 K/uL (ref 0.7–4.0)
MCH: 25.4 pg — ABNORMAL LOW (ref 26.0–34.0)
MCHC: 31.9 g/dL (ref 30.0–36.0)
MCV: 79.7 fL — ABNORMAL LOW (ref 80.0–100.0)
Monocytes Absolute: 0.5 K/uL (ref 0.1–1.0)
Monocytes Relative: 8 %
Neutro Abs: 3.4 K/uL (ref 1.7–7.7)
Neutrophils Relative %: 52 %
Platelets: 180 K/uL (ref 150–400)
RBC: 6.41 MIL/uL — ABNORMAL HIGH (ref 4.22–5.81)
RDW: 13.9 % (ref 11.5–15.5)
WBC: 6.5 K/uL (ref 4.0–10.5)
nRBC: 0 % (ref 0.0–0.2)

## 2021-12-15 LAB — LIPASE, BLOOD: Lipase: 35 U/L (ref 11–51)

## 2021-12-15 LAB — URINALYSIS, ROUTINE W REFLEX MICROSCOPIC
Bilirubin Urine: NEGATIVE
Glucose, UA: NEGATIVE mg/dL
Hgb urine dipstick: NEGATIVE
Ketones, ur: NEGATIVE mg/dL
Leukocytes,Ua: NEGATIVE
Nitrite: NEGATIVE
Protein, ur: NEGATIVE mg/dL
Specific Gravity, Urine: 1.008 (ref 1.005–1.030)
pH: 7 (ref 5.0–8.0)

## 2021-12-15 MED ORDER — SODIUM CHLORIDE (PF) 0.9 % IJ SOLN
INTRAMUSCULAR | Status: AC
  Filled 2021-12-15: qty 50

## 2021-12-15 MED ORDER — SODIUM CHLORIDE 0.9 % IV BOLUS
1000.0000 mL | Freq: Once | INTRAVENOUS | Status: AC
  Administered 2021-12-15: 1000 mL via INTRAVENOUS

## 2021-12-15 MED ORDER — IOHEXOL 300 MG/ML  SOLN
100.0000 mL | Freq: Once | INTRAMUSCULAR | Status: AC | PRN
  Administered 2021-12-15: 100 mL via INTRAVENOUS

## 2021-12-15 MED ORDER — PANTOPRAZOLE SODIUM 20 MG PO TBEC: 20.0000 mg | DELAYED_RELEASE_TABLET | Freq: Every day | ORAL | 1 refills | Status: DC

## 2021-12-15 NOTE — ED Provider Notes (Signed)
[facility]-EMERGENCY DEPT Provider Note   CSN: [id number] Arrival date & time: 12/15/21  9323     History {Add pertinent medical, surgical, social history, OB history to HPI:1} Chief Complaint  Patient presents with   Abdominal Pain    Bruce Sims is a 36 y.o. male.  Patient complains of epigastric abdominal pain for a couple weeks.  No vomiting no diarrhea   Abdominal Pain      Home Medications Prior to Admission medications   Medication Sig Start Date End Date Taking? Authorizing Provider  pantoprazole (PROTONIX) 20 MG tablet Take 1 tablet (20 mg total) by mouth daily. 12/15/21  Yes Bethann Berkshire, MD  hydrochlorothiazide (HYDRODIURIL) 25 MG tablet Take 1 tablet (25 mg total) by mouth daily. 11/27/19 06/03/20  Henderson Cloud, MD      Allergies    Patient has no known allergies.    Review of Systems   Review of Systems  Gastrointestinal:  Positive for abdominal pain.    Physical Exam Updated Vital Signs BP (!) 125/92   Pulse 73   Temp 98.3 F (36.8 C)   Resp 18   SpO2 99%  Physical Exam  ED Results / Procedures / Treatments   Labs (all labs ordered are listed, but only abnormal results are displayed) Labs Reviewed  CBC WITH DIFFERENTIAL/PLATELET - Abnormal; Notable for the following components:      Result Value   RBC 6.41 (*)    MCV 79.7 (*)    MCH 25.4 (*)    All other components within normal limits  COMPREHENSIVE METABOLIC PANEL - Abnormal; Notable for the following components:   Total Protein 8.3 (*)    ALT 45 (*)    All other components within normal limits  URINALYSIS, ROUTINE W REFLEX MICROSCOPIC - Abnormal; Notable for the following components:   Color, Urine STRAW (*)    All other components within normal limits  LIPASE, BLOOD    EKG None  Radiology CT ABDOMEN PELVIS W CONTRAST  Result Date: 12/15/2021 CLINICAL DATA:  Abdominal pain for 2 months. EXAM: CT ABDOMEN AND PELVIS WITH CONTRAST TECHNIQUE:  Multidetector CT imaging of the abdomen and pelvis was performed using the standard protocol following bolus administration of intravenous contrast. RADIATION DOSE REDUCTION: This exam was performed according to the departmental dose-optimization program which includes automated exposure control, adjustment of the mA and/or kV according to patient size and/or use of iterative reconstruction technique. CONTRAST:  OMNIPAQUE IOHEXOL 300 MG/ML  SOLN COMPARISON:  None Available. FINDINGS: Lower chest: The lung bases are clear. The imaged heart is unremarkable. Hepatobiliary: The liver and gallbladder are unremarkable. There is no biliary ductal dilatation. Pancreas: Unremarkable. Spleen: Unremarkable. Adrenals/Urinary Tract: The adrenals are unremarkable. The kidneys are unremarkable, with no focal lesion, stone, hydronephrosis, or hydroureter. The bladder is unremarkable. Stomach/Bowel: The stomach is unremarkable. There is no evidence of bowel obstruction. There is no abnormal bowel wall thickening or inflammatory change. The appendix is normal. Vascular/Lymphatic: The abdominal aorta is normal in course and caliber. The major branch vessels are patent. The main portal and splenic veins are patent. There is no abdominal or pelvic lymphadenopathy. Reproductive: The prostate and seminal vesicles are unremarkable. Other: There is no ascites or free air. Musculoskeletal: There is no acute osseous abnormality or suspicious osseous lesion. IMPRESSION: Unremarkable CT abdomen/pelvis with no acute finding to explain the patient's pain. Electronically Signed   By: Lesia Hausen M.D.   On: 12/15/2021 11:00  Procedures Procedures  {Document cardiac monitor, telemetry assessment procedure when appropriate:1}  Medications Ordered in ED Medications  sodium chloride (PF) 0.9 % injection (has no administration in time range)  sodium chloride 0.9 % bolus 1,000 mL (0 mLs Intravenous Stopped 12/15/21 1041)  iohexol  (OMNIPAQUE) 300 MG/ML solution 100 mL (100 mLs Intravenous Contrast Given 12/15/21 1038)    ED Course/ Medical Decision Making/ A&P  Labs and CT unremarkable.  Patient placed on protonic                         Medical Decision Making Amount and/or Complexity of Data Reviewed Labs: ordered. Radiology: ordered.  Risk Prescription drug management.  Patient with chronic abdominal pain.  He will be placed on Protonix and follow-up with GI  {Document critical care time when appropriate:1} {Document review of labs and clinical decision tools ie heart score, Chads2Vasc2 etc:1}  {Document your independent review of radiology images, and any outside records:1} {Document your discussion with family members, caretakers, and with consultants:1} {Document social determinants of health affecting pt's care:1} {Document your decision making why or why not admission, treatments were needed:1} Final Clinical Impression(s) / ED Diagnoses Final diagnoses:  Pain of upper abdomen    Rx / DC Orders ED Discharge Orders          Ordered    pantoprazole (PROTONIX) 20 MG tablet  Daily        12/15/21 1414

## 2021-12-15 NOTE — Discharge Instructions (Addendum)
Follow-up with Dr. Ewing Schlein or one of his associates in the next 2 to 3 weeks if you do not improve

## 2021-12-15 NOTE — ED Triage Notes (Signed)
Patient said he has had abdominal pain for 2 months across his whole stomach. No vomiting. Said he had diarrhea 2-3 times.

## 2021-12-18 ENCOUNTER — Telehealth: Payer: Self-pay

## 2021-12-18 ENCOUNTER — Ambulatory Visit: Payer: No Typology Code available for payment source | Admitting: Family Medicine

## 2021-12-18 NOTE — Telephone Encounter (Signed)
Transition Care Management Unsuccessful Follow-up Telephone Call  Date of discharge and from where:  12/15/2021 from [facility] ED  Attempts:  1st Attempt  Reason for unsuccessful TCM follow-up call:  No answer/busy

## 2022-03-16 ENCOUNTER — Ambulatory Visit (HOSPITAL_COMMUNITY)
Admission: EM | Admit: 2022-03-16 | Discharge: 2022-03-16 | Disposition: A | Discharge status: Home / Self Care | Payer: No Typology Code available for payment source | Attending: Internal Medicine | Admitting: Internal Medicine

## 2022-03-16 ENCOUNTER — Encounter (HOSPITAL_COMMUNITY): Payer: Self-pay

## 2022-03-16 DIAGNOSIS — R198 Other specified symptoms and signs involving the digestive system and abdomen: Secondary | ICD-10-CM

## 2022-03-16 HISTORY — DX: Gastro-esophageal reflux disease without esophagitis: K21.9

## 2022-03-16 MED ORDER — DICYCLOMINE HCL 10 MG PO CAPS: 10.0000 mg | ORAL_CAPSULE | Freq: Three times a day (TID) | ORAL | 0 refills | Status: AC

## 2022-03-16 NOTE — ED Provider Notes (Signed)
[place]    CSN: [id number] Arrival date & time: 03/16/22  0801      History   Chief Complaint No chief complaint on file.   HPI Bruce Sims is a 37 y.o. male who presents with history of stomach rumbling and growling x 1 year and feels it on the LUQ, but denies N/V/C/D. Was seen in ER one year ago and prescribed Protonix and he took it for 2 months but has not helped. He has normal BM's twice a day, first thing in am and at bed time. Has noticed since exercising his symptoms are a little better. He does not have a PCP.      Past Medical History:  Diagnosis Date   GERD (gastroesophageal reflux disease)    Hypertension     There are no problems to display for this patient.   Past Surgical History:  Procedure Laterality Date   arm surgery Right        Home Medications    Prior to Admission medications   Medication Sig Start Date End Date Taking? Authorizing Provider  dicyclomine (BENTYL) 10 MG capsule Take 1 capsule (10 mg total) by mouth 3 (three) times daily before meals. 03/16/22  Yes Rodriguez-Southworth, Sunday Spillers, PA-C  hydrochlorothiazide (HYDRODIURIL) 25 MG tablet Take 1 tablet (25 mg total) by mouth daily. 11/27/19 06/03/20  Erline Hau, MD    Family History Family History  Problem Relation Age of Onset   Stroke Mother    Stroke Father    Hypertension Father     Social History Social History   Tobacco Use   Smoking status: Never   Smokeless tobacco: Never  Vaping Use   Vaping Use: Never used  Substance Use Topics   Alcohol use: Never   Drug use: Never     Allergies   Patient has no known allergies.   Review of Systems Review of Systems  Constitutional:  Negative for fever.  And as noted in HPI   Physical Exam Triage Vital Signs ED Triage Vitals  Enc Vitals Group     BP 03/16/22 0812 (!) 158/109     Pulse Rate 03/16/22 0812 69     Resp 03/16/22 0812 16     Temp 03/16/22 0812 98.1 F (36.7 C)     Temp Source  03/16/22 0812 Oral     SpO2 03/16/22 0812 98 %     Weight --      Height --      Head Circumference --      Peak Flow --      Pain Score 03/16/22 0815 0     Pain Loc --      Pain Edu? --      Excl. in [place]? --    No data found.  Updated Vital Signs BP (!) 158/109 (BP Location: Right Arm)   Pulse 69   Temp 98.1 F (36.7 C) (Oral)   Resp 16   SpO2 98%   Visual Acuity Right Eye Distance:   Left Eye Distance:   Bilateral Distance:    Right Eye Near:   Left Eye Near:    Bilateral Near:     Physical Exam Vitals and nursing note reviewed.  Constitutional:      General: He is not in acute distress.    Appearance: He is not toxic-appearing.  HENT:     Right Ear: External ear normal.     Left Ear: External ear normal.  Eyes:  General: No scleral icterus.    Conjunctiva/sclera: Conjunctivae normal.  Pulmonary:     Effort: Pulmonary effort is normal.  Abdominal:     General: Abdomen is flat. Bowel sounds are normal.     Palpations: Abdomen is soft. There is no mass.     Tenderness: There is no abdominal tenderness. There is no guarding or rebound.     Hernia: No hernia is present.  Musculoskeletal:        General: Normal range of motion.     Cervical back: Neck supple.  Skin:    General: Skin is warm and dry.     Findings: No rash.  Neurological:     Mental Status: He is alert and oriented to person, place, and time.     Gait: Gait normal.  Psychiatric:        Mood and Affect: Mood normal.        Behavior: Behavior normal.        Thought Content: Thought content normal.        Judgment: Judgment normal.      UC Treatments / Results  Labs (all labs ordered are listed, but only abnormal results are displayed) Labs Reviewed - No data to display  EKG   Radiology No results found.  Procedures Procedures (including critical care time)  Medications Ordered in UC Medications - No data to display  Initial Impression / Assessment and Plan / UC Course  I  have reviewed the triage vital signs and the nursing notes.  Stomach growling  He was advised to start with taking Digestive enzymes before meals for a week and if this does not help, try Bentyl in case is IBS. Needs to FU with GI if neither help. See instructions Final Clinical Impressions(s) / UC Diagnoses   Final diagnoses:  Stomach growling     Discharge Instructions      Stomach noise is normal and part of digestion specially if it does not cause pain.  Get digestive enzymes and take it before each meal and see how this may help you.  How do you get rid of a noisy stomach? Fortunately, there are several ways to stop your stomach from growling. Drink water. If you're stuck somewhere you can't eat and your stomach is rumbling away, drinking water can help stop it. ... Eat slowly. ... Eat more regularly. ... Chew slowly. ... Limit gas-triggering foods. ... Reduce acidic foods. ... Don't overeat. ... Walk after you eat.   For the next week, before you start the Bentyl prescription, try digestive enzymes( combination type or papaya one) or eat papaya fruit with your meals and see if this helps, if so just stay on this, but if not then try the prescription. If your symptoms persist after trying both treatments, then you need to see a stomach specialist.      ED Prescriptions     Medication Sig Dispense Auth. Provider   dicyclomine (BENTYL) 10 MG capsule Take 1 capsule (10 mg total) by mouth 3 (three) times daily before meals. 60 capsule Rodriguez-Southworth, Sunday Spillers, PA-C      PDMP not reviewed this encounter.   Shelby Mattocks, [place] 03/16/22 [id number]

## 2022-03-16 NOTE — Discharge Instructions (Addendum)
Stomach noise is normal and part of digestion specially if it does not cause pain.  Get digestive enzymes and take it before each meal and see how this may help you.  How do you get rid of a noisy stomach? Fortunately, there are several ways to stop your stomach from growling. Drink water. If you're stuck somewhere you can't eat and your stomach is rumbling away, drinking water can help stop it. ... Eat slowly. ... Eat more regularly. ... Chew slowly. ... Limit gas-triggering foods. ... Reduce acidic foods. ... Don't overeat. ... Walk after you eat.   For the next week, before you start the Bentyl prescription, try digestive enzymes( combination type or papaya one) or eat papaya fruit with your meals and see if this helps, if so just stay on this, but if not then try the prescription. If your symptoms persist after trying both treatments, then you need to see a stomach specialist.

## 2022-03-16 NOTE — ED Triage Notes (Addendum)
Patient denies any pain, V/D. Patient states his stomach is rumbling and growing. Patient states he took Protonix a year ago, but it didn't help. Patient states he called the pharmacy and they told him he needed to go see a doctor or go to an UC for a different medicine.  Patient states he has not taken any other meds for his symptom.
